# Patient Record
Sex: Male | Born: 1990 | Race: White | Hispanic: No | Marital: Single
Health system: Southern US, Community
[De-identification: ages and names within clinical notes are randomized; demographics above are authoritative.]

## PROBLEM LIST (undated history)

## (undated) HISTORY — PX: FINGER SURGERY: SHX640

---

## 2014-11-08 ENCOUNTER — Encounter (HOSPITAL_COMMUNITY): Payer: Self-pay | Admitting: Emergency Medicine

## 2014-11-08 ENCOUNTER — Emergency Department (HOSPITAL_COMMUNITY)
Admission: EM | Admit: 2014-11-08 | Discharge: 2014-11-08 | Disposition: A | Discharge status: Home / Self Care | Payer: BLUE CROSS/BLUE SHIELD | Attending: Emergency Medicine | Admitting: Emergency Medicine

## 2014-11-08 ENCOUNTER — Emergency Department (HOSPITAL_COMMUNITY): Payer: BLUE CROSS/BLUE SHIELD

## 2014-11-08 DIAGNOSIS — S61412A Laceration without foreign body of left hand, initial encounter: Secondary | ICD-10-CM | POA: Diagnosis not present

## 2014-11-08 DIAGNOSIS — Y998 Other external cause status: Secondary | ICD-10-CM | POA: Insufficient documentation

## 2014-11-08 DIAGNOSIS — W274XXA Contact with kitchen utensil, initial encounter: Secondary | ICD-10-CM | POA: Insufficient documentation

## 2014-11-08 DIAGNOSIS — Y9289 Other specified places as the place of occurrence of the external cause: Secondary | ICD-10-CM | POA: Insufficient documentation

## 2014-11-08 DIAGNOSIS — Y9389 Activity, other specified: Secondary | ICD-10-CM | POA: Insufficient documentation

## 2014-11-08 DIAGNOSIS — Z23 Encounter for immunization: Secondary | ICD-10-CM | POA: Insufficient documentation

## 2014-11-08 DIAGNOSIS — S59912A Unspecified injury of left forearm, initial encounter: Secondary | ICD-10-CM | POA: Diagnosis present

## 2014-11-08 DIAGNOSIS — S51812A Laceration without foreign body of left forearm, initial encounter: Secondary | ICD-10-CM | POA: Insufficient documentation

## 2014-11-08 MED ORDER — LIDOCAINE HCL 2 % IJ SOLN
10.0000 mL | Freq: Once | INTRAMUSCULAR | Status: AC
  Administered 2014-11-08: 200 mg via INTRADERMAL
  Filled 2014-11-08: qty 20

## 2014-11-08 MED ORDER — BACITRACIN ZINC 500 UNIT/GM EX OINT
TOPICAL_OINTMENT | CUTANEOUS | Status: AC
  Administered 2014-11-08: 19:00:00
  Filled 2014-11-08: qty 0.9

## 2014-11-08 MED ORDER — TETANUS-DIPHTH-ACELL PERTUSSIS 5-2.5-18.5 LF-MCG/0.5 IM SUSP
0.5000 mL | Freq: Once | INTRAMUSCULAR | Status: AC
  Administered 2014-11-08: 0.5 mL via INTRAMUSCULAR
  Filled 2014-11-08: qty 0.5

## 2014-11-08 NOTE — ED Notes (Signed)
Verbalized understanding discharge instructions and follow-up. In no acute distress.   

## 2014-11-08 NOTE — ED Provider Notes (Signed)
CSN: [id number]     Arrival date & time 11/08/14  1728 History  This chart was scribed for non-physician practitioner, Teressa LowerVrinda Andreal Vultaggio, NP, working with Elwin MochaBlair Walden, MD by Charline BillsEssence Howell, ED Scribe. This patient was seen in room WTR6/WTR6 and the patient's care was started at 5:36 PM.   Chief Complaint  Patient presents with  . Extremity Laceration   The history is provided by the patient. No language interpreter was used.   HPI Comments: Roy Mayer is a 24 y.o. male who presents to the Emergency Department complaining of a laceration to the left knuckle and left forearm sustained PTA. Pt states that he was using a grinder when a piece of metal popped off and cut his left arm and knuckle. Pt reports a constant throbbing sesnation to his left knuckle that is exacerbated with palpation and movement. He denies numbness. Bleeding is controlled. Pt's last tetanus is unknown. No known allergies.   No past medical history on file. No past surgical history on file. No family history on file. History  Substance Use Topics  . Smoking status: Not on file  . Smokeless tobacco: Not on file  . Alcohol Use: Not on file    Review of Systems  Skin: Positive for wound.  Neurological: Negative for numbness.  All other systems reviewed and are negative.  Allergies  Review of patient's allergies indicates not on file.  Home Medications   Prior to Admission medications   Not on File   BP 141/78 mmHg  Pulse 109  Temp(Src) 98.5 F (36.9 C) (Oral)  SpO2 94% Physical Exam  Constitutional: He is oriented to person, place, and time. He appears well-developed and well-nourished. No distress.  HENT:  Head: Normocephalic and atraumatic.  Eyes: Conjunctivae and EOM are normal.  Neck: Neck supple. No tracheal deviation present.  Cardiovascular: Normal rate.   Pulmonary/Chest: Effort normal. No respiratory distress.  Musculoskeletal: Normal range of motion.  Neurological: He is alert and oriented  to person, place, and time.  Skin: Skin is warm and dry.  Laceration to the left hand at the base of the pinky finger. Laceration to the left forearm. Full rom to hand and wrist  Psychiatric: He has a normal mood and affect. His behavior is normal.  Nursing note and vitals reviewed.  ED Course  LACERATION REPAIR Date/Time: 11/08/2014 6:41 PM Performed by: Teressa LowerPICKERING, Marisel Tostenson Authorized by: Teressa LowerPICKERING, Calais Svehla Consent: Verbal consent obtained. Risks and benefits: risks, benefits and alternatives were discussed Consent given by: patient Patient identity confirmed: verbally with patient Time out: Immediately prior to procedure a "time out" was called to verify the correct patient, procedure, equipment, support staff and site/side marked as required. Body area: upper extremity Location details: left hand Laceration length: 1 cm Anesthesia: local infiltration Skin closure: 4-0 Prolene Number of sutures: 2 Technique: simple Approximation: close Approximation difficulty: simple Patient tolerance: Patient tolerated the procedure well with no immediate complications  LACERATION REPAIR Date/Time: 11/08/2014 6:42 PM Performed by: Teressa LowerPICKERING, Brinleigh Tew Authorized by: Teressa LowerPICKERING, Tessla Spurling Consent: Verbal consent obtained. Risks and benefits: risks, benefits and alternatives were discussed Consent given by: patient Patient identity confirmed: verbally with patient Time out: Immediately prior to procedure a "time out" was called to verify the correct patient, procedure, equipment, support staff and site/side marked as required. Body area: upper extremity Location details: left lower arm Laceration length: 2 cm Anesthesia: local infiltration and see MAR for details Irrigation solution: saline Skin closure: 3-0 Prolene Number of sutures: 4 Technique: simple Approximation: close  Approximation difficulty: simple Patient tolerance: Patient tolerated the procedure well with no immediate complications    (including critical care time) DIAGNOSTIC STUDIES: Oxygen Saturation is 94% on RA, adequate by my interpretation.    COORDINATION OF CARE: 5:39 PM-Discussed treatment plan which includes tdap, sutures and XR with pt at bedside and pt agreed to plan.   Labs Review Labs Reviewed - No data to display  Imaging Review Dg Forearm Left  11/08/2014   CLINICAL DATA:  24 year old male with a history of industrial accident and laceration of the forearm.  EXAM: LEFT FOREARM - 2 VIEW  COMPARISON:  None.  FINDINGS: No acute bony abnormality identified. No radiopaque foreign body. No significant soft tissue swelling.  IMPRESSION: Negative for acute bony abnormality.  Negative for radiopaque foreign body.  Signed,  Yvone Neu. Loreta Ave, DO  [facility]  [facility]   Electronically Signed   By: Gilmer Mor D.O.   On: 11/08/2014 18:12   Dg Hand Complete Left  11/08/2014   CLINICAL DATA:  Laceration of the ulnar side of the forearm. Grinding injury today with metal grinder.  EXAM: LEFT HAND - COMPLETE 3+ VIEW  COMPARISON:  None.  FINDINGS: No radiopaque foreign body or fracture. Ulnar deviation of the LEFT small finger at the PIP joint. Alignment is otherwise within normal limits.  IMPRESSION: Negative.   Electronically Signed   By: Andreas Newport M.D.   On: 11/08/2014 18:13     EKG Interpretation None      MDM   Final diagnoses:  Hand laceration, left, initial encounter  Forearm laceration, left, initial encounter    Wounds closed. No sign of fb or injury noted on x-ray. Neurovascularly intact. Given return precautions  I personally performed the services described in this documentation, which was scribed in my presence. The recorded information has been reviewed and is accurate.    Teressa Lower, NP 11/08/14 1844  Elwin Mocha, MD 11/08/14 [phone]

## 2014-11-08 NOTE — Discharge Instructions (Signed)

## 2014-11-08 NOTE — ED Notes (Signed)
Pt states that he cut his L forearm and L knuckle on a piece of metal. Bleeding controlled. Alert and oriented. Tetanus unknown.

## 2016-07-19 IMAGING — CR DG FOREARM 2V*L*
2 series · 2 of 2 positions shown · non-contrast
Comparison: None.

CLINICAL DATA: 24-year-old male with a history of industrial
accident and laceration of the forearm.

EXAM:
LEFT FOREARM - 2 VIEW

[x forearm ap left]
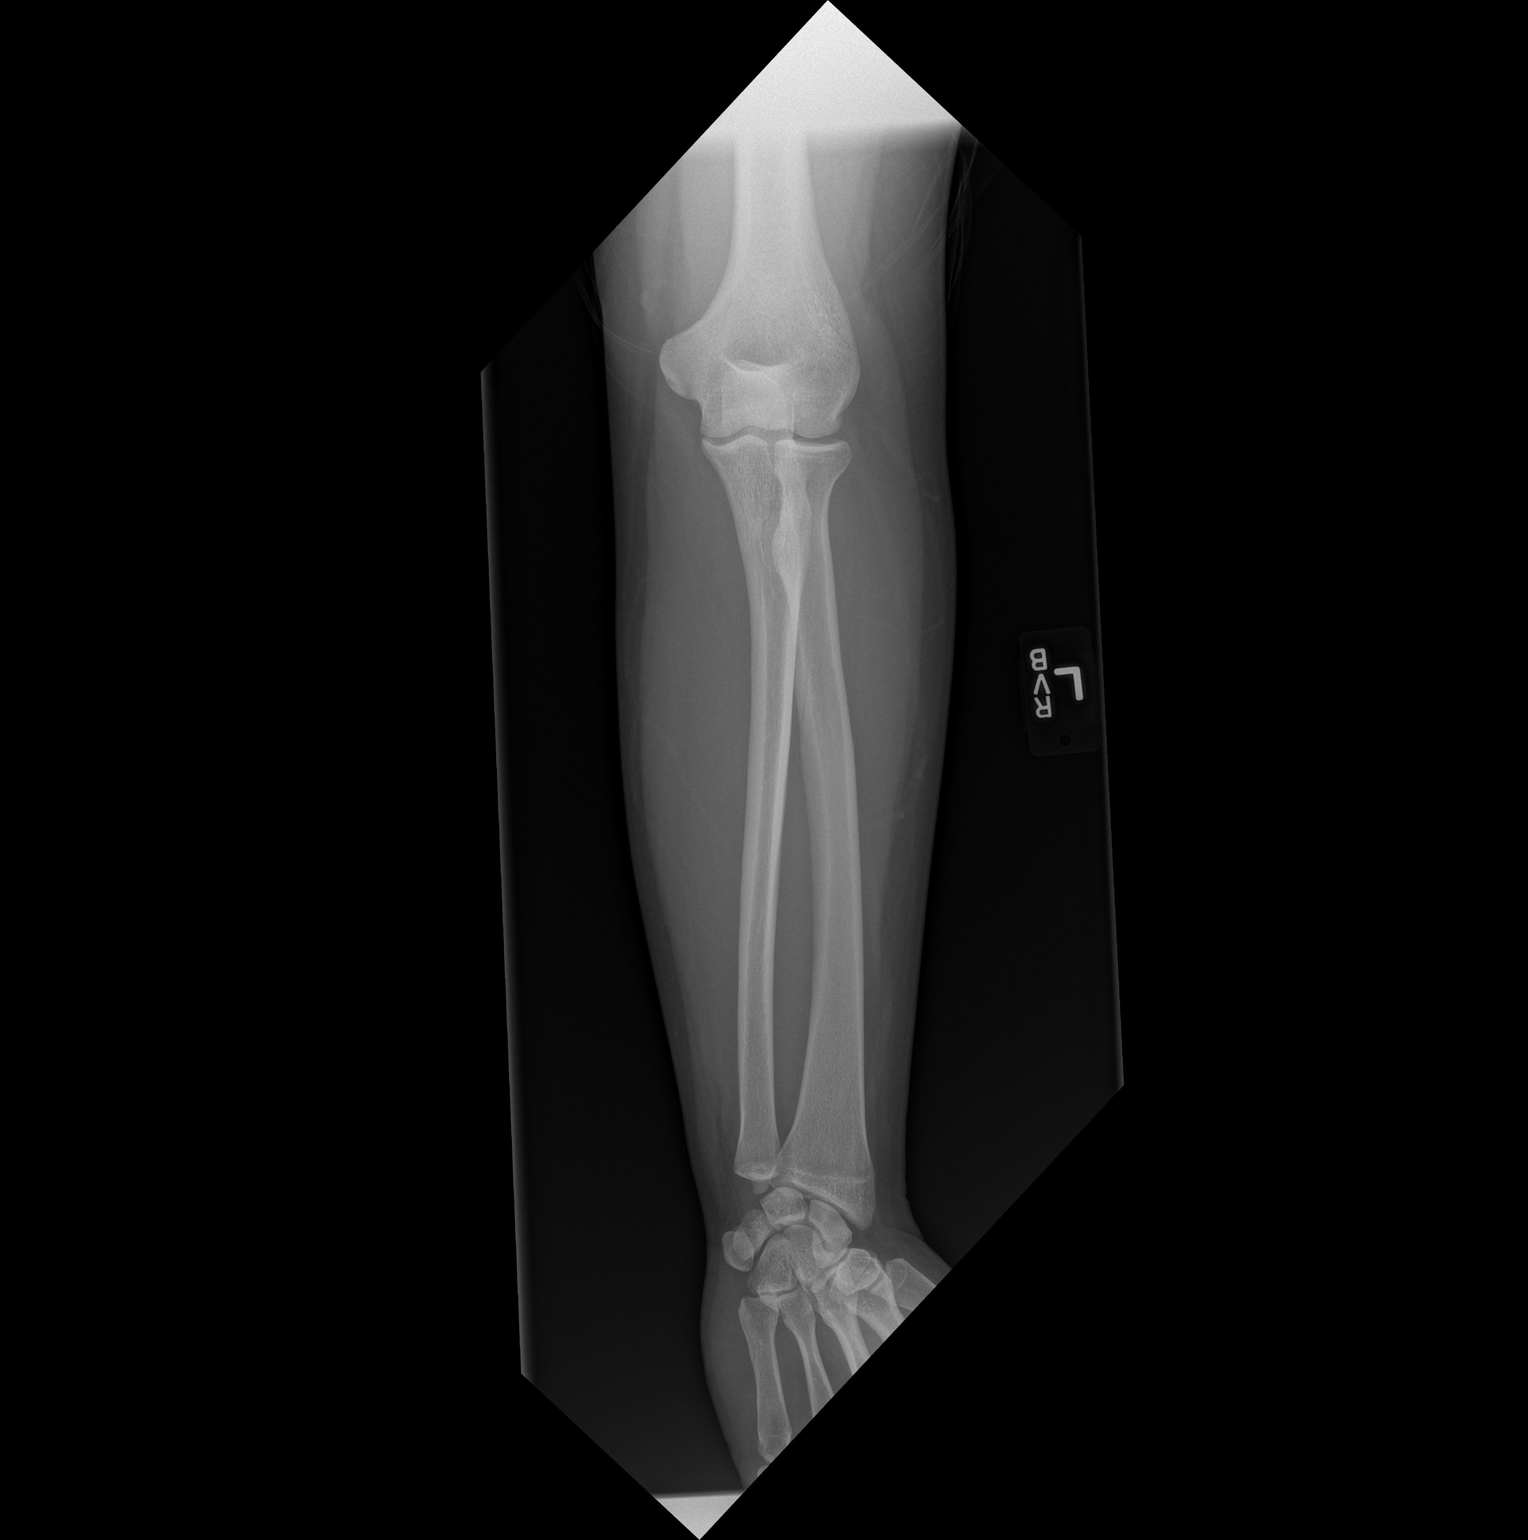

[x forearm lat left]
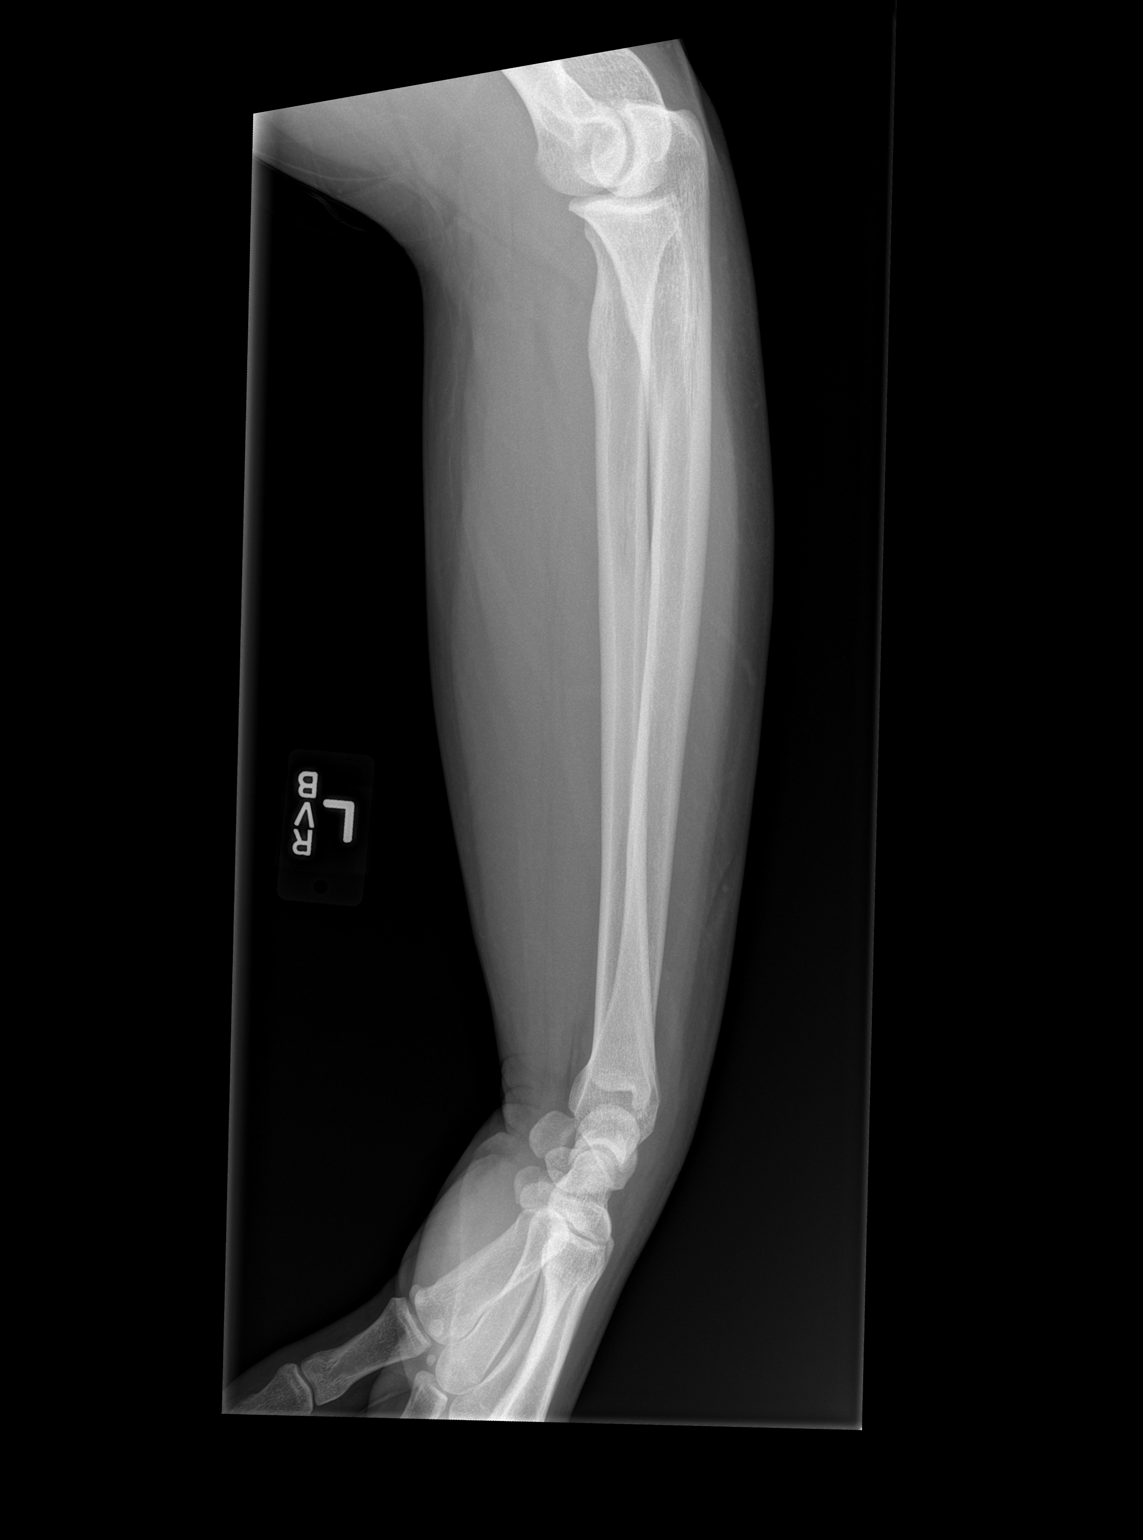

[2 of 2 positions shown; findings below may reference images not displayed]

FINDINGS: No acute bony abnormality identified. No radiopaque foreign body. No
significant soft tissue swelling.
IMPRESSION: Negative for acute bony abnormality.

Negative for radiopaque foreign body.
